# Patient Record
Sex: Male | Born: 1940 | Race: White | Hispanic: No | Marital: Married | Smoking: Former smoker
Health system: Southern US, Community
[De-identification: ages and names within clinical notes are randomized; demographics above are authoritative.]

## PROBLEM LIST (undated history)

## (undated) DIAGNOSIS — C439 Malignant melanoma of skin, unspecified: Secondary | ICD-10-CM

## (undated) DIAGNOSIS — F32A Depression, unspecified: Secondary | ICD-10-CM

## (undated) DIAGNOSIS — I10 Essential (primary) hypertension: Secondary | ICD-10-CM

## (undated) DIAGNOSIS — F329 Major depressive disorder, single episode, unspecified: Secondary | ICD-10-CM

## (undated) DIAGNOSIS — C679 Malignant neoplasm of bladder, unspecified: Secondary | ICD-10-CM

## (undated) HISTORY — PX: CERVICAL FUSION: SHX112

## (undated) HISTORY — PX: HIP SURGERY: SHX245

## (undated) HISTORY — PX: ANKLE FRACTURE SURGERY: SHX122

---

## 2008-07-06 DIAGNOSIS — C679 Malignant neoplasm of bladder, unspecified: Secondary | ICD-10-CM

## 2008-07-06 HISTORY — DX: Malignant neoplasm of bladder, unspecified: C67.9

## 2016-08-21 ENCOUNTER — Emergency Department: Payer: Medicare Other

## 2016-08-21 ENCOUNTER — Emergency Department
Admission: EM | Admit: 2016-08-21 | Discharge: 2016-08-21 | Payer: Medicare Other | Attending: Emergency Medicine | Admitting: Emergency Medicine

## 2016-08-21 ENCOUNTER — Encounter: Payer: Self-pay | Admitting: Intensive Care

## 2016-08-21 DIAGNOSIS — S72002A Fracture of unspecified part of neck of left femur, initial encounter for closed fracture: Secondary | ICD-10-CM | POA: Insufficient documentation

## 2016-08-21 DIAGNOSIS — S79912A Unspecified injury of left hip, initial encounter: Secondary | ICD-10-CM | POA: Diagnosis present

## 2016-08-21 DIAGNOSIS — S0990XA Unspecified injury of head, initial encounter: Secondary | ICD-10-CM | POA: Diagnosis not present

## 2016-08-21 DIAGNOSIS — W01198A Fall on same level from slipping, tripping and stumbling with subsequent striking against other object, initial encounter: Secondary | ICD-10-CM | POA: Diagnosis not present

## 2016-08-21 DIAGNOSIS — Y999 Unspecified external cause status: Secondary | ICD-10-CM | POA: Diagnosis not present

## 2016-08-21 DIAGNOSIS — Z87891 Personal history of nicotine dependence: Secondary | ICD-10-CM | POA: Insufficient documentation

## 2016-08-21 DIAGNOSIS — Y929 Unspecified place or not applicable: Secondary | ICD-10-CM | POA: Insufficient documentation

## 2016-08-21 DIAGNOSIS — Y939 Activity, unspecified: Secondary | ICD-10-CM | POA: Insufficient documentation

## 2016-08-21 DIAGNOSIS — I10 Essential (primary) hypertension: Secondary | ICD-10-CM | POA: Diagnosis not present

## 2016-08-21 HISTORY — DX: Major depressive disorder, single episode, unspecified: F32.9

## 2016-08-21 HISTORY — DX: Malignant melanoma of skin, unspecified: C43.9

## 2016-08-21 HISTORY — DX: Depression, unspecified: F32.A

## 2016-08-21 HISTORY — DX: Essential (primary) hypertension: I10

## 2016-08-21 HISTORY — DX: Malignant neoplasm of bladder, unspecified: C67.9

## 2016-08-21 LAB — CBC WITH DIFFERENTIAL/PLATELET
Basophils Absolute: 0 10*3/uL (ref 0–0.1)
Basophils Relative: 0 %
Eosinophils Absolute: 0.1 10*3/uL (ref 0–0.7)
Eosinophils Relative: 1 %
HEMATOCRIT: 39.4 % — AB (ref 40.0–52.0)
HEMOGLOBIN: 13.4 g/dL (ref 13.0–18.0)
LYMPHS ABS: 0.8 10*3/uL — AB (ref 1.0–3.6)
Lymphocytes Relative: 6 %
MCH: 27.4 pg (ref 26.0–34.0)
MCHC: 34.1 g/dL (ref 32.0–36.0)
MCV: 80.3 fL (ref 80.0–100.0)
MONOS PCT: 5 %
Monocytes Absolute: 0.8 10*3/uL (ref 0.2–1.0)
NEUTROS PCT: 88 %
Neutro Abs: 13.3 10*3/uL — ABNORMAL HIGH (ref 1.4–6.5)
Platelets: 260 10*3/uL (ref 150–440)
RBC: 4.91 MIL/uL (ref 4.40–5.90)
RDW: 13.7 % (ref 11.5–14.5)
WBC: 15 10*3/uL — AB (ref 3.8–10.6)

## 2016-08-21 LAB — COMPREHENSIVE METABOLIC PANEL
ALT: 27 U/L (ref 17–63)
ANION GAP: 7 (ref 5–15)
AST: 28 U/L (ref 15–41)
Albumin: 4.4 g/dL (ref 3.5–5.0)
Alkaline Phosphatase: 104 U/L (ref 38–126)
BUN: 18 mg/dL (ref 6–20)
CO2: 30 mmol/L (ref 22–32)
CREATININE: 0.99 mg/dL (ref 0.61–1.24)
Calcium: 9.1 mg/dL (ref 8.9–10.3)
Chloride: 98 mmol/L — ABNORMAL LOW (ref 101–111)
Glucose, Bld: 137 mg/dL — ABNORMAL HIGH (ref 65–99)
POTASSIUM: 3.8 mmol/L (ref 3.5–5.1)
SODIUM: 135 mmol/L (ref 135–145)
Total Bilirubin: 0.5 mg/dL (ref 0.3–1.2)
Total Protein: 7.6 g/dL (ref 6.5–8.1)

## 2016-08-21 LAB — PROTIME-INR
INR: 0.92
PROTHROMBIN TIME: 12.3 s (ref 11.4–15.2)

## 2016-08-21 MED ORDER — MORPHINE SULFATE (PF) 4 MG/ML IV SOLN
4.0000 mg | Freq: Once | INTRAVENOUS | Status: AC
  Administered 2016-08-21: 4 mg via INTRAVENOUS

## 2016-08-21 MED ORDER — MORPHINE SULFATE (PF) 4 MG/ML IV SOLN
INTRAVENOUS | Status: AC
  Filled 2016-08-21: qty 1

## 2016-08-21 NOTE — ED Notes (Signed)
Pt in hallway bed.  Family with pt.  Pt alert.

## 2016-08-21 NOTE — ED Notes (Signed)
Pt placed on 2 liters oxygen.  Pt intermittent sleeping.  Waiting on transfer.

## 2016-08-21 NOTE — ED Notes (Signed)
Pt in ct scan/xray now

## 2016-08-21 NOTE — ED Notes (Addendum)
Report given to lou rn at [place].   Pt waiting on ems for transport.

## 2016-08-21 NOTE — ED Notes (Signed)
Pt moved to room 11   Waiting on transfer to [facility].

## 2016-08-21 NOTE — ED Notes (Addendum)
Pt in hallway bed.  Pt tripped and fell today on the curb striking head and has left hip pain.  States painful to ambulate or move left leg.   Pt brought in via ems. Pt alert.  Speech clear.  Skin warm and dry.

## 2016-08-21 NOTE — ED Notes (Signed)
meds given for pain prior to transfer .  Ems with pt now for transfer to [place].  Pt alert.

## 2016-08-21 NOTE — ED Provider Notes (Addendum)
[facility] Emergency Department Provider Note  ____________________________________________   I have reviewed the triage vital signs and the nursing notes.   HISTORY  Chief Complaint Fall and Hip Pain (Left)    HPI Glen Hutchinson is a 77 y.o. male who presents today complaining of left hip pain after a nonsurgical fall. He tripped. He has had 2 surgeries on that hip in the past 1 and 1991 and a revision in 2012.The patient states that he did bump his head when he fell as well he is not on Coumadin or any blood thinners. He denies any significant headache etc. He has no pain unless he moves that hip however he is unable to ambulate because of pain in the hip.     Past Medical History:  Diagnosis Date  . Bladder cancer ([place]) 07/2008  . Depression   . Hypertension   . Melanoma ([place])     There are no active problems to display for this patient.   Past Surgical History:  Procedure Laterality Date  . ANKLE FRACTURE SURGERY Right   . CERVICAL FUSION    . HIP SURGERY Left     Prior to Admission medications   Not on File    Allergies Patient has no known allergies.  History reviewed. No pertinent family history.  Social History Social History  Substance Use Topics  . Smoking status: Former Research scientist (life sciences)  . Smokeless tobacco: Never Used  . Alcohol use No    Review of Systems Constitutional: No fever/chills Eyes: No visual changes. ENT: No sore throat. No stiff neck no neck pain Cardiovascular: Denies chest pain. Respiratory: Denies shortness of breath. Gastrointestinal:   no vomiting.  No diarrhea.  No constipation. Genitourinary: Negative for dysuria. Musculoskeletal: Negative lower extremity swelling Skin: Negative for rash. Neurological: Negative for severe headaches, focal weakness or numbness. 10-point ROS otherwise negative.  ____________________________________________   PHYSICAL EXAM:  VITAL SIGNS: ED Triage Vitals  Enc Vitals  Group     BP 08/21/16 1658 (!) 162/89     Pulse Rate 08/21/16 1658 (!) 54     Resp 08/21/16 1658 20     Temp 08/21/16 1658 98.7 F (37.1 C)     Temp Source 08/21/16 1658 Oral     SpO2 08/21/16 1658 99 %     Weight 08/21/16 1659 222 lb (100.7 kg)     Height 08/21/16 1659 6\' 3"  (1.905 m)     Head Circumference --      Peak Flow --      Pain Score 08/21/16 1659 7     Pain Loc --      Pain Edu? --      Excl. in [place]? --     Constitutional: Alert and oriented. Well appearing and in no acute distress. Eyes: Conjunctivae are normal. PERRL. EOMI. Head: Atraumatic. Nose: No congestion/rhinnorhea. Mouth/Throat: Mucous membranes are moist.  Oropharynx non-erythematous. Neck: No stridor.   Nontender with no meningismus Cardiovascular: Normal rate, regular rhythm. Grossly normal heart sounds.  Good peripheral circulation. Respiratory: Normal respiratory effort.  No retractions. Lungs CTAB. Abdominal: Soft and nontender. No distention. No guarding no rebound Back:  There is no focal tenderness or step off.  there is no midline tenderness there are no lesions noted. there is no CVA tenderness Musculoskeletal: Patient with minimal tenderness to palpation of the left hip and significant pain to their left hip with ranging. Not an open fracture. No obvious deformity. upper extremity tenderness. No joint effusions, no  DVT signs strong distal pulses no edema Neurologic:  Normal speech and language. No gross focal neurologic deficits are appreciated.  Skin:  Skin is warm, dry and intact. No rash noted. Psychiatric: Mood and affect are normal. Speech and behavior are normal.  ____________________________________________   LABS (all labs ordered are listed, but only abnormal results are displayed)  Labs Reviewed - No data to display ____________________________________________  EKG  I personally interpreted any EKGs ordered by me or triage Sinus bradycardia rate 56 bpm, no acute ST elevation or  depression, left axis deviation, right bundle branch block noted. ____________________________________________  [id number]  I reviewed any imaging ordered by me or triage that were performed during my shift and, if possible, patient and/or family made aware of any abnormal findings. ____________________________________________   PROCEDURES  Procedure(s) performed: None  Procedures  Critical Care performed: None  ____________________________________________   INITIAL IMPRESSION / ASSESSMENT AND PLAN / ED COURSE  Pertinent labs & imaging results that were available during my care of the patient were reviewed by me and considered in my medical decision making (see chart for details).  Patient with a non-syncopal fall today complains of hip pain. He tripped. No evidence of acute pathology noted aside from left hip pain although given his age I did do a CT scan. That is negative. No neck pain I don't think he is in excess positive, he has no distracting discomfort as long as I'm not manipulate his hip. Unfortunately he is unable to ambulate with this hip. I did call Dr. Mack Guise of orthopedic surgery who reviewed the patient's films, he states that given this type of fracture the patient needs to be seen by joint specialist at [facility] or [place]. These findings to the family, they opted to see if they could go to [facility]. I have therefore called [facility] and we are awaiting call back. Patient has no pain unless I manipulate the hip as he is sitting here he has no discomfort.   ----------------------------------------- 8:39 PM on 08/21/2016 -----------------------------------------  Did hear back from [facility] orthopedic surgery there is discussing with attending and will let me know.  ----------------------------------------- 8:56 PM on 08/21/2016 -----------------------------------------  [facility] is declining the patient at this time. They are asking to see if I can transfer the patient to [facility] where his primary orthopedic surgeon is because they are reluctant to take this patient. We will attempt to call the [facility] surgeon    ----------------------------------------- 9:15 PM on 08/21/2016 -----------------------------------------  Page to Dr. Richardo Priest at [facility] placed. I have some real concerns about trying to transfer him that far logistically. We are also calling [facility].   ----------------------------------------- 9:37 PM on 08/21/2016 -----------------------------------------  D/w dr. Robert Bellow of [place], who feels the pt should be managed locally advises calling [facility] and [facility]. I am calling again as we have not heard back yet from [place] in the last 45 minutes and I have no disposition for this patient despite hours of trying and 3 different orthopedic surgeons discussed. Patient is waiting comfortably after pain medication and awaiting transfer. Also paging [facility] again.  ----------------------------------------- 10:13 PM on 08/21/2016 -----------------------------------------  Still awaiting call backs from either [facility] or [facility]. Signed out at the end of my shift to dr. Dineen Kid. Pt made aware of our ongoing quest to find an orthopedic surgeon to take care of his orthopedic problem.  He is resting comfortably at this time.   ----------------------------------------- 10:32 PM on 08/21/2016 -----------------------------------------  D/w Dr. Mitchell Heir  [place], who accepts pt at [facility], after having talked to [place].      FINAL CLINICAL IMPRESSION(S) / ED DIAGNOSES  Final diagnoses:  None      This chart was dictated using voice recognition software.  Despite best efforts to proofread,  errors can occur which can change meaning.      Schuyler Amor, MD 08/21/16 1925    Schuyler Amor, MD 08/21/16 2039    Schuyler Amor, MD 08/21/16 2057    Schuyler Amor, MD 08/21/16 2116    Schuyler Amor, MD 08/21/16 2138     Schuyler Amor, MD 08/21/16 [place], MD 08/21/16 [place], MD 08/21/16 2233

## 2016-08-21 NOTE — ED Triage Notes (Signed)
PAtient arrived by EMS from [facility] for fall. Patient tripped over curb and fell on L hip and reports hitting back of head. Denies being on blood thinners. Patient was able to put weight on feet and ambulate but has extreme pain noted to L hip and slight pain to back of head. HX HTN. A&O x4

## 2016-08-21 NOTE — Consult Note (Addendum)
Asked to review xrays on Glen Hutchinson, a 76 year old male s/p left THA who fell onto his left hip today at a local restaurant.  Xray films of the left hip and pelvis demonstrate an intertrochanteric hip fracture around a press fit femoral stem.  The fracture appears to destabilize the femoral component.   I am recommending transfer to a tertiary center for revision of the femoral component to a long stem prosthesis vs. ORIF.   Patient would benefit from a fellowship trained arthroplasty specialist for treatment of this injury.

## 2016-08-21 NOTE — ED Notes (Signed)
meds given.  Family with pt.

## 2017-12-27 IMAGING — CT CT HEAD W/O CM
3 series · 16 of 47 positions shown, 19 images · non-contrast
Comparison: None.

CLINICAL DATA: Fall and hit head

EXAM:
CT HEAD WITHOUT CONTRAST
TECHNIQUE: Contiguous axial images were obtained from the base of the skull
through the vertex without intravenous contrast.

[Series 2: head wo · axial · 0.47mm/px · z∈[-202,-77]mm · 10 of 31 slices shown, 13 images]
[im 3/31  brain]
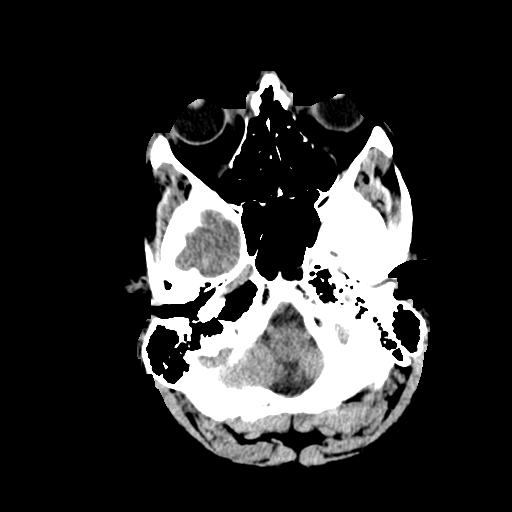
[im 3/31  bone]
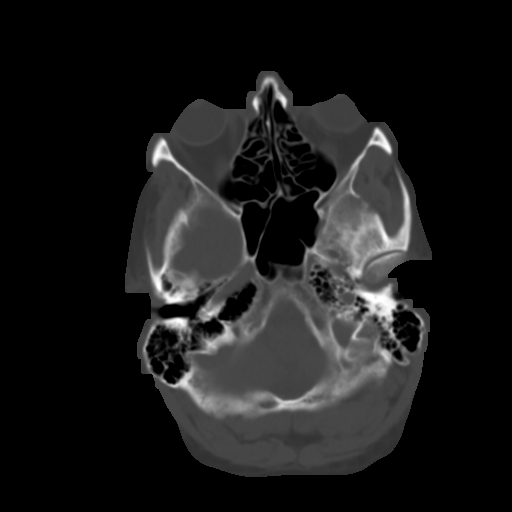
[im 6/31  brain]
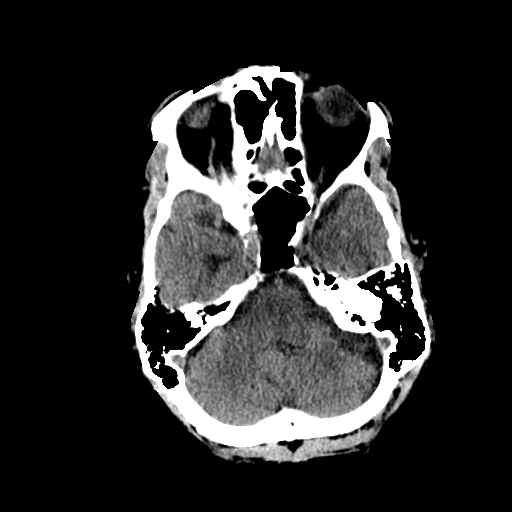
[im 9/31  brain]
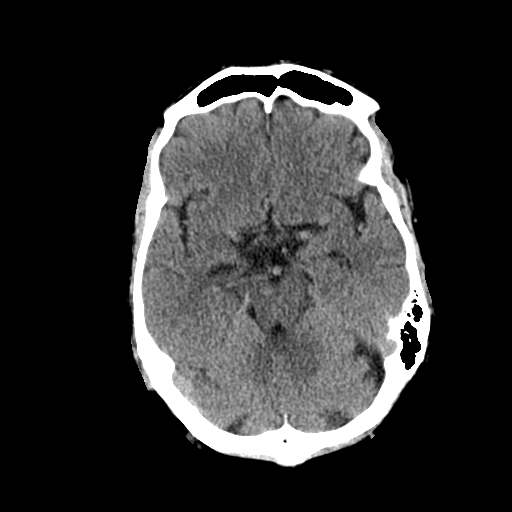
[im 11/31  brain]
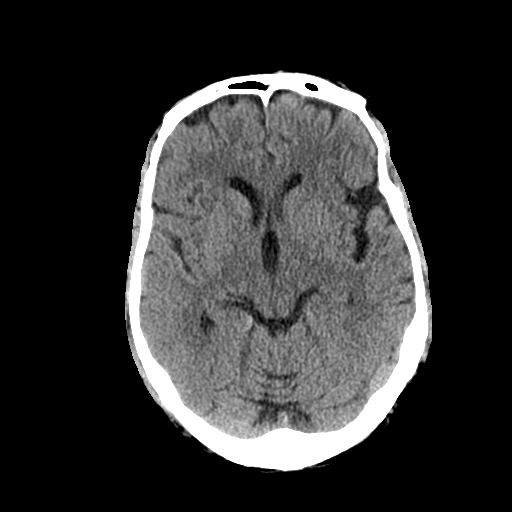
[im 14/31  brain]
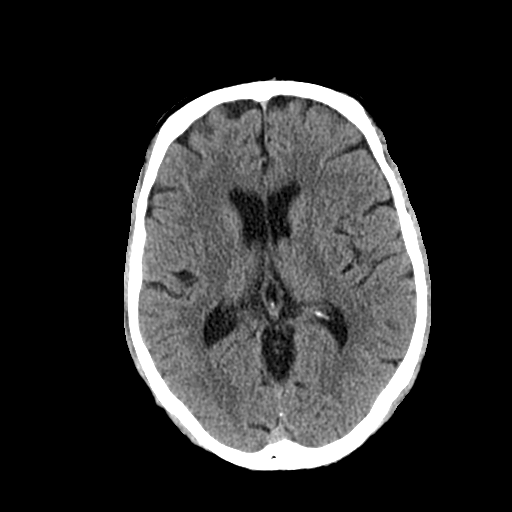
[im 14/31  bone]
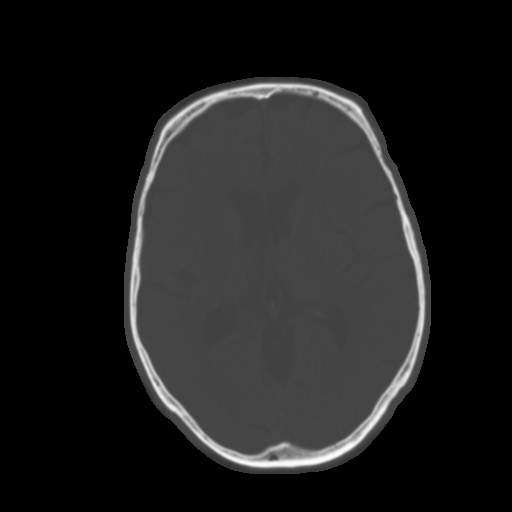
[im 17/31  brain]
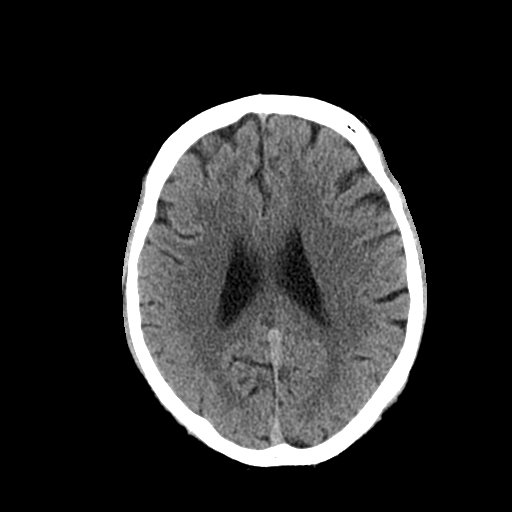
[im 20/31  brain]
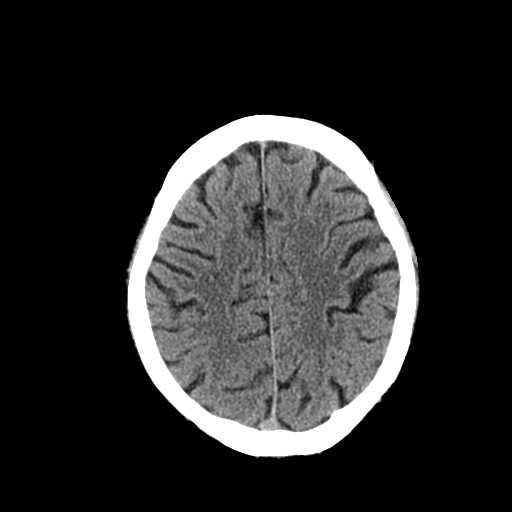
[im 23/31  brain]
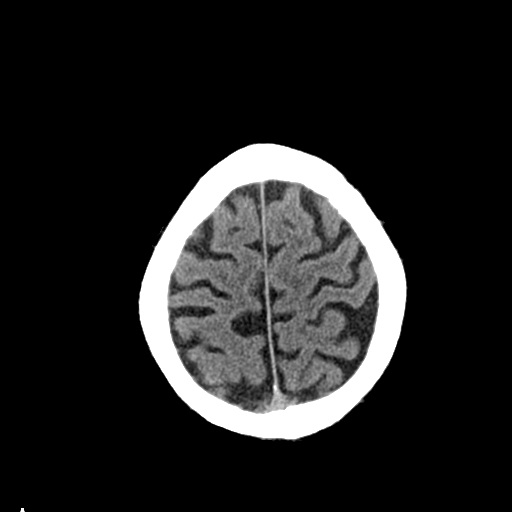
[im 25/31  brain]
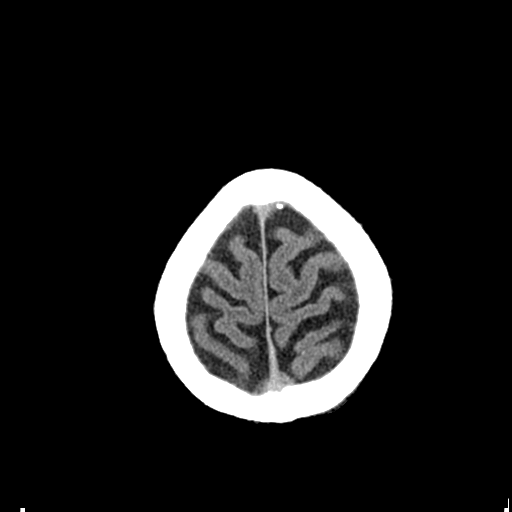
[im 25/31  bone]
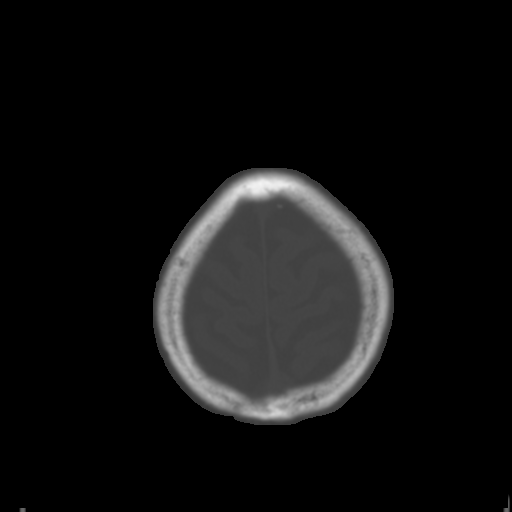
[im 28/31  brain]
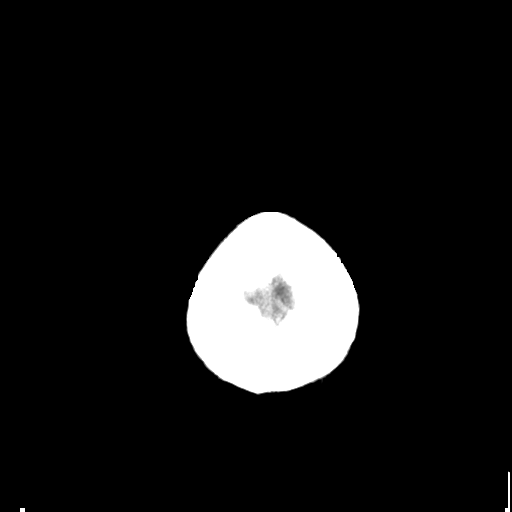

[Series 4: coronal soft tissue · coronal · 0.31mm/px · 3 of 66 slices shown]
[im 22/66  brain]
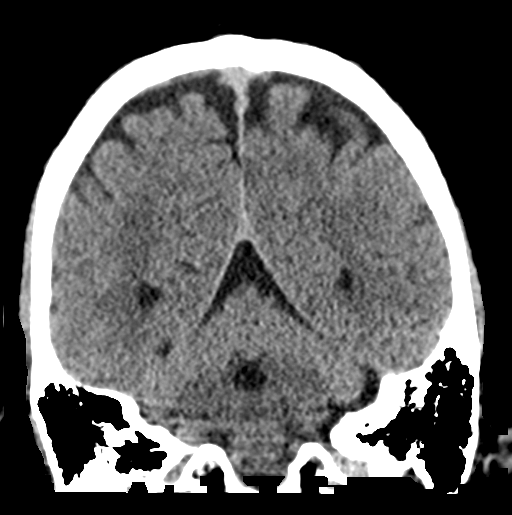
[im 29/66  brain]
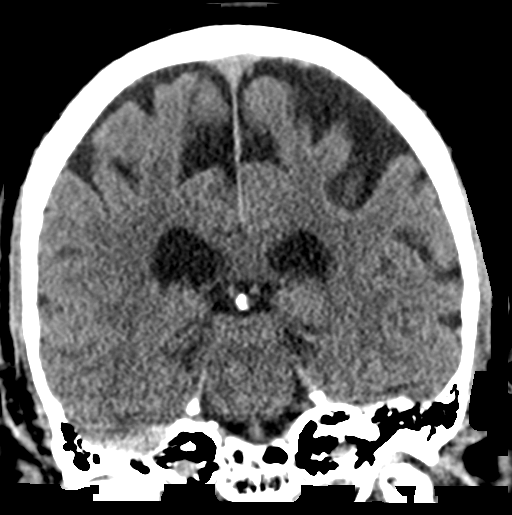
[im 37/66  brain]
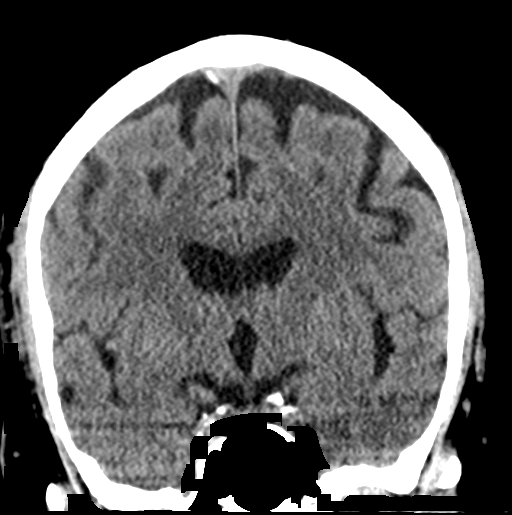

[Series 5: sagittal soft tissue · sagittal · 0.32mm/px · 3 of 52 slices shown]
[im 18/52  brain]
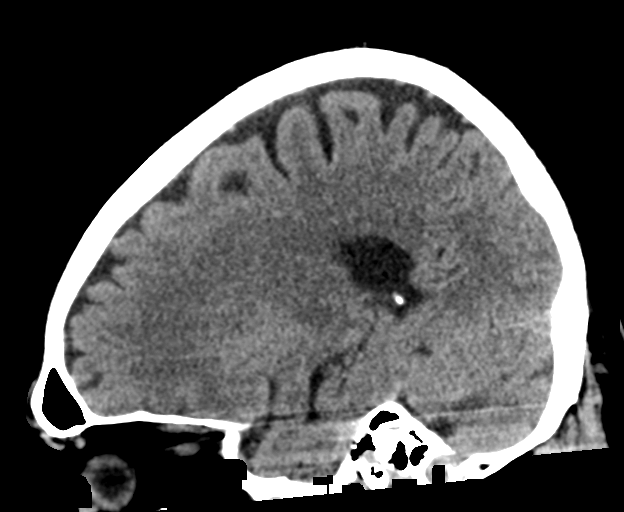
[im 26/52  brain]
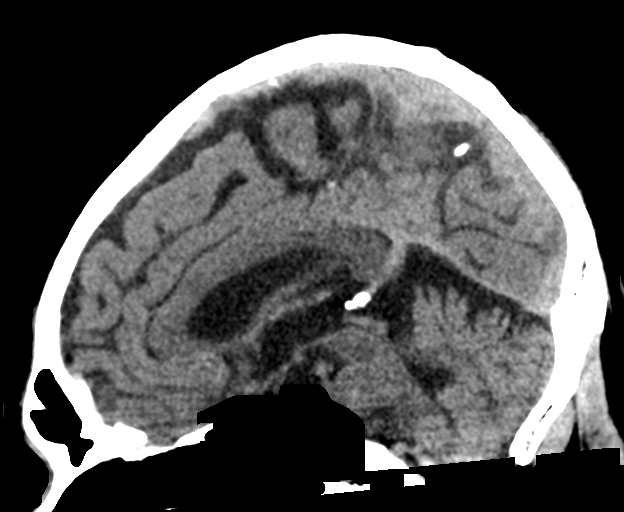
[im 35/52  brain]
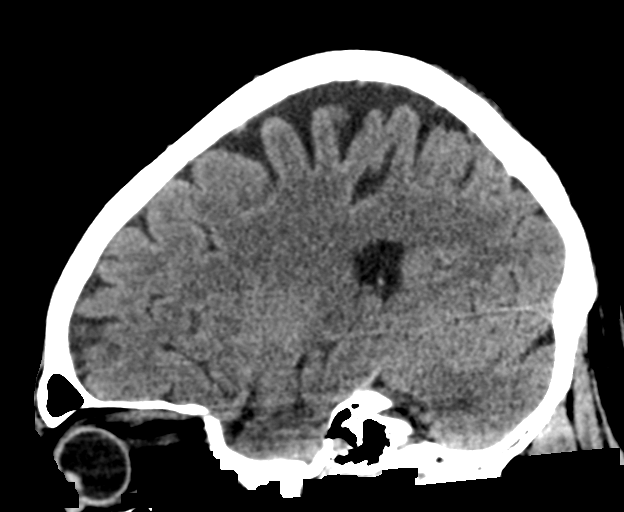

[16 of 47 positions shown; findings below may reference images not displayed]

FINDINGS: Brain: No acute territorial infarction, intracranial hemorrhage or
focal mass lesion is visualized. Mild atrophy. Ventricles do not
appear enlarged.

Vascular: No hyperdense vessels. Scattered carotid artery
calcification.

Skull: No fracture. No suspicious bone lesion. Nonspecific sclerotic
focus in the left mandibular head.

Sinuses/Orbits: Mucosal thickening in the ethmoid sinuses. Possible
osteoma right frontal sinus. No acute orbital abnormality.

Other: None
IMPRESSION: No CT evidence for acute intracranial abnormality.
